# Patient Record
Sex: Male | Born: 1960 | Race: White | Hispanic: No | Marital: Married | Smoking: Former smoker
Health system: Southern US, Community
[De-identification: ages and names within clinical notes are randomized; demographics above are authoritative.]

## PROBLEM LIST (undated history)

## (undated) DIAGNOSIS — M9250 Juvenile osteochondrosis of tibia and fibula, unspecified leg: Secondary | ICD-10-CM

## (undated) DIAGNOSIS — M92529 Juvenile osteochondrosis of tibia tubercle, unspecified leg: Secondary | ICD-10-CM

## (undated) DIAGNOSIS — K219 Gastro-esophageal reflux disease without esophagitis: Secondary | ICD-10-CM

## (undated) HISTORY — DX: Gastro-esophageal reflux disease without esophagitis: K21.9

## (undated) HISTORY — DX: Juvenile osteochondrosis of tibia tubercle, unspecified leg: M92.529

## (undated) HISTORY — PX: ADENOIDECTOMY: SUR15

## (undated) HISTORY — DX: Juvenile osteochondrosis of tibia and fibula, unspecified leg: M92.50

## (undated) HISTORY — PX: TONSILLECTOMY: SUR1361

---

## 2015-08-24 DIAGNOSIS — R21 Rash and other nonspecific skin eruption: Secondary | ICD-10-CM | POA: Diagnosis not present

## 2017-06-21 ENCOUNTER — Telehealth: Payer: Self-pay

## 2017-06-21 NOTE — Telephone Encounter (Signed)
Copied from CRM [phone]. Topic: Inquiry >> Jun 21, 2017  2:58 PM Diana EvesHoyt, Maryann B wrote: Reason for CRM: pts girlfriend calling in stating pt didn't receive new pt paper work and is hoping that can be resent to him.

## 2017-06-21 NOTE — Telephone Encounter (Signed)
I called the patient to confirm that his address was correct and sent out new patient paperwork .

## 2017-07-04 ENCOUNTER — Other Ambulatory Visit: Payer: Self-pay

## 2017-07-04 ENCOUNTER — Encounter: Payer: Self-pay | Admitting: Family Medicine

## 2017-07-04 ENCOUNTER — Ambulatory Visit (INDEPENDENT_AMBULATORY_CARE_PROVIDER_SITE_OTHER): Payer: BLUE CROSS/BLUE SHIELD | Admitting: Family Medicine

## 2017-07-04 VITALS — BP 158/94 | HR 86 | Temp 97.8°F | Ht 74.5 in | Wt 263.0 lb

## 2017-07-04 DIAGNOSIS — J309 Allergic rhinitis, unspecified: Secondary | ICD-10-CM | POA: Insufficient documentation

## 2017-07-04 DIAGNOSIS — R5383 Other fatigue: Secondary | ICD-10-CM | POA: Insufficient documentation

## 2017-07-04 DIAGNOSIS — M545 Low back pain, unspecified: Secondary | ICD-10-CM

## 2017-07-04 DIAGNOSIS — R252 Cramp and spasm: Secondary | ICD-10-CM | POA: Insufficient documentation

## 2017-07-04 DIAGNOSIS — H9193 Unspecified hearing loss, bilateral: Secondary | ICD-10-CM | POA: Diagnosis not present

## 2017-07-04 DIAGNOSIS — L570 Actinic keratosis: Secondary | ICD-10-CM | POA: Insufficient documentation

## 2017-07-04 DIAGNOSIS — R030 Elevated blood-pressure reading, without diagnosis of hypertension: Secondary | ICD-10-CM

## 2017-07-04 DIAGNOSIS — M549 Dorsalgia, unspecified: Secondary | ICD-10-CM | POA: Insufficient documentation

## 2017-07-04 DIAGNOSIS — I10 Essential (primary) hypertension: Secondary | ICD-10-CM | POA: Insufficient documentation

## 2017-07-04 MED ORDER — FLUTICASONE PROPIONATE 50 MCG/ACT NA SUSP: 2.0000 | Freq: Every day | NASAL | 6 refills | Status: DC

## 2017-07-04 NOTE — Assessment & Plan Note (Signed)
Offered referral for evaluation though he declined.

## 2017-07-04 NOTE — Assessment & Plan Note (Signed)
Lesions concerning for actinic keratoses.  Referral to dermatology placed.

## 2017-07-04 NOTE — Assessment & Plan Note (Signed)
Discussed potential causes.  Denies depression.  Could be thyroid related or testosterone related.  Offered lab testing though he deferred to physical exam.

## 2017-07-04 NOTE — Progress Notes (Signed)
Marikay AlarEric Geraldy Akridge, MD Phone: [phone]  Aaron Dawson is a 57 y.o. male who presents today for new patient visit.  Patient reports for a couple of years now he has had some decreased energy.  He just wants to sit around when he is at home.  Does not have the get up and go to go out and do things.  He notes no exertional symptoms.  He denies depression.  Patient notes a couple of dry spots on his right dorsal aspect hand and lower forearm.  Notes they come and go though never completely healed.  Notes sun exposure as a child though none recently.  He reports he has had to use reading glasses recently.  Notes some difficulty hearing that is minor.  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  He reports several months ago having an episode of back discomfort and associated right lateral thigh stinging and burning/numbness.  He notes that has resolved and not recurred.  No incontinence or saddle anesthesia.  No weakness.  He reports issues with drainage and rhinorrhea that intermittently recurred.  He feels this has led to some hoarseness that occurs on some days.  Notes he is breathing well.  Symptoms mostly bother him at night.  He did smoke and quit 2 years ago.  He occasionally has cramps in his calves at night.  He tries to drink water or work.  Notes he has to stretch to get them to feel better.    No history of hypertension.   Active Ambulatory Problems    Diagnosis Date Noted  . Actinic keratoses 07/04/2017  . Decreased energy 07/04/2017  . Decreased hearing of both ears 07/04/2017  . Cramps of lower extremity 07/04/2017  . Back pain 07/04/2017  . Allergic rhinitis 07/04/2017  . Elevated blood pressure reading 07/04/2017   Resolved Ambulatory Problems    Diagnosis Date Noted  . No Resolved Ambulatory Problems   Past Medical History:  Diagnosis Date  . GERD (gastroesophageal reflux disease)   . Osgood-Schlatter's disease     Family History    Problem Relation Age of Onset  . Arthritis Mother   . Diabetes Mother   . Hypertension Mother   . Stroke Mother   . Heart attack Mother   . Arthritis Father   . Alcohol abuse Father   . Diabetes Father   . Hearing loss Father   . Hyperlipidemia Father   . Hypertension Father   . Asthma Father   . COPD Maternal Grandmother   . Diabetes Maternal Grandmother     Social History   Socioeconomic History  . Marital status: Married    Spouse name: Not on file  . Number of children: Not on file  . Years of education: Not on file  . Highest education level: Not on file  Occupational History  . Not on file  Social Needs  . Financial resource strain: Not on file  . Food insecurity:    Worry: Not on file    Inability: Not on file  . Transportation needs:    Medical: Not on file    Non-medical: Not on file  Tobacco Use  . Smoking status: Former Games developermoker  . Smokeless tobacco: Never Used  Substance and Sexual Activity  . Alcohol use: Yes  . Drug use: Never  . Sexual activity: Not on file  Lifestyle  . Physical activity:    Days per week: Not on file    Minutes  per session: Not on file  . Stress: Not on file  Relationships  . Social connections:    Talks on phone: Not on file    Gets together: Not on file    Attends religious service: Not on file    Active member of club or organization: Not on file    Attends meetings of clubs or organizations: Not on file    Relationship status: Not on file  . Intimate partner violence:    Fear of current or ex partner: Not on file    Emotionally abused: Not on file    Physically abused: Not on file    Forced sexual activity: Not on file  Other Topics Concern  . Not on file  Social History Narrative  . Not on file    ROS  General:  Negative for nexplained weight loss, fever Skin: Negative for new or changing mole, sore that won't heal HEENT: Positive for trouble hearing, trouble seeing, hoarseness, negative for ringing in ears,  mouth sores, change in voice, dysphagia. CV:  Negative for chest pain, dyspnea, edema, palpitations Resp: Negative for cough, dyspnea, hemoptysis GI: Negative for nausea, vomiting, diarrhea, constipation, abdominal pain, melena, hematochezia. GU: Negative for dysuria, incontinence, urinary hesitance, hematuria, vaginal or penile discharge, polyuria, sexual difficulty, lumps in testicle or breasts MSK: Positive for muscle cramps or aches, negative for joint pain or swelling Neuro: Negative for headaches, weakness, numbness, dizziness, passing out/fainting Psych: Negative for depression, anxiety, memory problems  Objective  Physical Exam Vitals:   07/04/17 1320  BP: (!) 158/94  Pulse: 86  Temp: 97.8 F (36.6 C)  SpO2: 98%    BP Readings from Last 3 Encounters:  07/04/17 (!) 158/94   Wt Readings from Last 3 Encounters:  07/04/17 263 lb (119.3 kg)    Physical Exam  Constitutional: No distress.  HENT:  Head: Normocephalic and atraumatic.  Mouth/Throat: Oropharynx is clear and moist. No oropharyngeal exudate.  Normal TMs  Eyes: Pupils are equal, round, and reactive to light. Conjunctivae are normal.  Cardiovascular: Normal rate, regular rhythm and normal heart sounds.  Pulmonary/Chest: Effort normal and breath sounds normal.  Abdominal: Soft. Bowel sounds are normal. He exhibits no distension. There is no tenderness. There is no rebound and no guarding.  Musculoskeletal: He exhibits no edema.  Neurological: He is alert. Gait normal.  Skin: Skin is warm and dry. He is not diaphoretic.  Psychiatric: Mood and affect normal.     Assessment/Plan:   Actinic keratoses Lesions concerning for actinic keratoses.  Referral to dermatology placed.  Decreased energy Discussed potential causes.  Denies depression.  Could be thyroid related or testosterone related.  Offered lab testing though he deferred to physical exam.  Decreased hearing of both ears Offered referral for evaluation  though he declined.  Cramps of lower extremity Possibly dehydration related.  Discussed adequate hydration.  Discussed lab evaluation for this though he deferred until his physical.  Advised stretching.  Back pain Low back pain with possible nerve impingement.  This has not recurred.  He monitor for recurrence.  Allergic rhinitis Patient with allergy symptoms that are likely leading to his hoarseness.  I did discuss given the persistence of the hoarseness that we could consider evaluation with ear nose and throat though he opted for treatment for his allergies.  I feel this is reasonable and if he does not improve with regards to his hoarseness with treatment of his allergies he needs to see ENT.  Flonase sent to pharmacy.  Elevated blood pressure reading No prior history though he has not seen a physician in many years.  We will recheck at his physical and if still elevated consider starting medication.   Orders Placed This Encounter  Procedures  . Ambulatory referral to Dermatology    Referral Priority:   Routine    Referral Type:   Consultation    Referral Reason:   Specialty Services Required    Requested Specialty:   Dermatology    Number of Visits Requested:   1    Meds ordered this encounter  Medications  . fluticasone (FLONASE) 50 MCG/ACT nasal spray    Sig: Place 2 sprays into both nostrils daily.    Dispense:  16 g    Refill:  6     [name], MD [facility]

## 2017-07-04 NOTE — Assessment & Plan Note (Signed)
Patient with allergy symptoms that are likely leading to his hoarseness.  I did discuss given the persistence of the hoarseness that we could consider evaluation with ear nose and throat though he opted for treatment for his allergies.  I feel this is reasonable and if he does not improve with regards to his hoarseness with treatment of his allergies he needs to see ENT.  Flonase sent to pharmacy.

## 2017-07-04 NOTE — Assessment & Plan Note (Signed)
Possibly dehydration related.  Discussed adequate hydration.  Discussed lab evaluation for this though he deferred until his physical.  Advised stretching.

## 2017-07-04 NOTE — Patient Instructions (Signed)
Nice to see you. We will trial you on Flonase for your upper respiratory symptoms.  If your symptoms are not improving with this please let us know. Please monitor for recurrence of the right thigh numbness and tingling.  If that recurs please let us know. Please stretch and stay well-hydrated for your cramps. We will have you return for a physical and plan on doing fasting lab work that day.

## 2017-07-04 NOTE — Assessment & Plan Note (Signed)
No prior history though he has not seen a physician in many years.  We will recheck at his physical and if still elevated consider starting medication.

## 2017-07-04 NOTE — Assessment & Plan Note (Addendum)
Low back pain with possible nerve impingement.  This has not recurred.  He monitor for recurrence.

## 2017-09-06 ENCOUNTER — Encounter: Payer: BLUE CROSS/BLUE SHIELD | Admitting: Family Medicine

## 2017-10-04 ENCOUNTER — Other Ambulatory Visit: Payer: Self-pay

## 2017-10-04 ENCOUNTER — Telehealth: Payer: Self-pay | Admitting: Family Medicine

## 2017-10-04 MED ORDER — FLUTICASONE PROPIONATE 50 MCG/ACT NA SUSP: 2.0000 | Freq: Every day | NASAL | 3 refills | Status: DC

## 2017-10-04 NOTE — Telephone Encounter (Signed)
Copied from CRM [id number]. Topic: General - Other >> Oct 04, 2017 11:29 AM Elliot GaultBell, Tiffany M wrote:  Relation to pt: self Call back number: [phone] [id number] [place] [address] [facility] [address] [phone] (Phone) [phone] (Fax)  Reason for call:  Requesting

## 2017-10-07 ENCOUNTER — Ambulatory Visit (INDEPENDENT_AMBULATORY_CARE_PROVIDER_SITE_OTHER): Payer: BLUE CROSS/BLUE SHIELD | Admitting: Family Medicine

## 2017-10-07 ENCOUNTER — Encounter: Payer: Self-pay | Admitting: Family Medicine

## 2017-10-07 VITALS — BP 158/104 | HR 77 | Temp 98.0°F | Resp 16 | Ht 74.5 in | Wt 261.8 lb

## 2017-10-07 DIAGNOSIS — Z114 Encounter for screening for human immunodeficiency virus [HIV]: Secondary | ICD-10-CM

## 2017-10-07 DIAGNOSIS — Z0001 Encounter for general adult medical examination with abnormal findings: Secondary | ICD-10-CM | POA: Insufficient documentation

## 2017-10-07 DIAGNOSIS — R5383 Other fatigue: Secondary | ICD-10-CM | POA: Diagnosis not present

## 2017-10-07 DIAGNOSIS — Z1159 Encounter for screening for other viral diseases: Secondary | ICD-10-CM | POA: Diagnosis not present

## 2017-10-07 DIAGNOSIS — Z1211 Encounter for screening for malignant neoplasm of colon: Secondary | ICD-10-CM

## 2017-10-07 DIAGNOSIS — E669 Obesity, unspecified: Secondary | ICD-10-CM

## 2017-10-07 DIAGNOSIS — H9193 Unspecified hearing loss, bilateral: Secondary | ICD-10-CM

## 2017-10-07 DIAGNOSIS — I10 Essential (primary) hypertension: Secondary | ICD-10-CM | POA: Diagnosis not present

## 2017-10-07 DIAGNOSIS — Z13220 Encounter for screening for lipoid disorders: Secondary | ICD-10-CM

## 2017-10-07 DIAGNOSIS — H539 Unspecified visual disturbance: Secondary | ICD-10-CM

## 2017-10-07 DIAGNOSIS — Z1212 Encounter for screening for malignant neoplasm of rectum: Secondary | ICD-10-CM

## 2017-10-07 DIAGNOSIS — R42 Dizziness and giddiness: Secondary | ICD-10-CM | POA: Diagnosis not present

## 2017-10-07 DIAGNOSIS — Z125 Encounter for screening for malignant neoplasm of prostate: Secondary | ICD-10-CM | POA: Diagnosis not present

## 2017-10-07 DIAGNOSIS — Z23 Encounter for immunization: Secondary | ICD-10-CM

## 2017-10-07 MED ORDER — AMLODIPINE BESYLATE 5 MG PO TABS: 5.0000 mg | ORAL_TABLET | Freq: Every day | ORAL | 3 refills | Status: DC

## 2017-10-07 NOTE — Patient Instructions (Signed)
Nice to see you. We will get lab work today and contact you with the results. Please monitor your depression and decreased energy.  If you would like medication please let us know.  If you develop thoughts of harming yourself please go to the emergency room. Please try to cut down on your alcohol intake.  Please do not cut alcohol out all at once as this would put you at risk for withdrawals and death.

## 2017-10-07 NOTE — Progress Notes (Signed)
Aaron Rumps, MD Phone: [phone]  Aaron Dawson is a 57 y.o. male who presents today for CPE.  Exercises by doing a physical job.  He also works as an Financial controller for sports. Diet is not great.  Rare sweet tea and rare soda. No prior hepatitis C or HIV testing. Tetanus vaccination is due. No prior history of colonoscopy.  No family history of colon cancer. No family history of prostate cancer. Patient quit smoking previously.  Smoked for 30 years 1 to 1.5 packs/day. Drinks a sixpack of beer 3-4 times a week.  Notes occasional marijuana use. He does not see a dentist.  He does note some decreased energy.  Just does not feel like doing things.  He notes some depression.  His parents passed away and that has affected him.  He is trying to take care of the mortgage that they left him.  No SI. He does note rare intermittent dizziness that he states is him feeling as though he is looking out into a haze and has difficulty focusing on things.  No room spinning sensation.  No lightheadedness.  Typically goes away after 10 minutes.  Last occurred a couple weeks ago.  Prior to that it had occurred about 6 months prior.  He does note having to wear readers to help him read though his distance vision is good. He has chronic issues with hearing.  He does wear earplugs at work.  He does not want his hearing tested.  Active Ambulatory Problems    Diagnosis Date Noted  . Actinic keratoses 07/04/2017  . Decreased energy 07/04/2017  . Decreased hearing of both ears 07/04/2017  . Cramps of lower extremity 07/04/2017  . Back pain 07/04/2017  . Allergic rhinitis 07/04/2017  . Hypertension 07/04/2017  . Encounter for general adult medical examination with abnormal findings 10/07/2017  . Vision changes 10/08/2017  . Dizziness 10/08/2017   Resolved Ambulatory Problems    Diagnosis Date Noted  . No Resolved Ambulatory Problems   Past Medical History:  Diagnosis Date  . GERD (gastroesophageal  reflux disease)   . Osgood-Schlatter's disease     Family History  Problem Relation Age of Onset  . Arthritis Mother   . Diabetes Mother   . Hypertension Mother   . Stroke Mother   . Heart attack Mother   . Arthritis Father   . Alcohol abuse Father   . Diabetes Father   . Hearing loss Father   . Hyperlipidemia Father   . Hypertension Father   . Asthma Father   . COPD Maternal Grandmother   . Diabetes Maternal Grandmother     Social History   Socioeconomic History  . Marital status: Married    Spouse name: Not on file  . Number of children: Not on file  . Years of education: Not on file  . Highest education level: Not on file  Occupational History  . Not on file  Social Needs  . Financial resource strain: Not on file  . Food insecurity:    Worry: Not on file    Inability: Not on file  . Transportation needs:    Medical: Not on file    Non-medical: Not on file  Tobacco Use  . Smoking status: Former Research scientist (life sciences)  . Smokeless tobacco: Never Used  Substance and Sexual Activity  . Alcohol use: Yes  . Drug use: Never  . Sexual activity: Not on file  Lifestyle  . Physical activity:    Days per week: Not on file  Minutes per session: Not on file  . Stress: Not on file  Relationships  . Social connections:    Talks on phone: Not on file    Gets together: Not on file    Attends religious service: Not on file    Active member of club or organization: Not on file    Attends meetings of clubs or organizations: Not on file    Relationship status: Not on file  . Intimate partner violence:    Fear of current or ex partner: Not on file    Emotionally abused: Not on file    Physically abused: Not on file    Forced sexual activity: Not on file  Other Topics Concern  . Not on file  Social History Narrative  . Not on file    ROS  General:  Negative for nexplained weight loss, fever Skin: Negative for new or changing mole, sore that won't heal HEENT: Positive for  trouble hearing, trouble seeing, negative for ringing in ears, mouth sores, hoarseness, change in voice, dysphagia. CV:  Negative for chest pain, dyspnea, edema, palpitations Resp: Negative for cough, dyspnea, hemoptysis GI: Negative for nausea, vomiting, diarrhea, constipation, abdominal pain, melena, hematochezia. GU: Negative for dysuria, incontinence, urinary hesitance, hematuria, vaginal or penile discharge, polyuria, sexual difficulty, lumps in testicle or breasts MSK: Positive for muscle cramps or aches, negative for joint pain or swelling Neuro: Positive for dizziness and weakness, negative for headaches, numbness, passing out/fainting Psych: Positive for depression, anxiety, negative for memory problems  Objective  Physical Exam Vitals:   10/07/17 1526 10/07/17 1623  BP: (!) 160/112 (!) 158/104  Pulse: 77   Resp: 16   Temp: 98 F (36.7 C)   SpO2: 98%     BP Readings from Last 3 Encounters:  10/07/17 (!) 158/104  07/04/17 (!) 158/94   Wt Readings from Last 3 Encounters:  10/07/17 261 lb 12.8 oz (118.8 kg)  07/04/17 263 lb (119.3 kg)    Physical Exam  Constitutional: No distress.  HENT:  Mouth/Throat: Oropharynx is clear and moist.  Normal TMs  Eyes: Pupils are equal, round, and reactive to light. Conjunctivae are normal.  Neck: Neck supple.  Cardiovascular: Normal rate, regular rhythm and normal heart sounds.  Pulmonary/Chest: Effort normal and breath sounds normal.  Abdominal: Soft. Bowel sounds are normal. He exhibits no distension. There is no tenderness.  Musculoskeletal: He exhibits no edema.  Lymphadenopathy:    He has no cervical adenopathy.  Neurological: He is alert.  Skin: Skin is warm and dry. He is not diaphoretic.     Assessment/Plan:   Encounter for general adult medical examination with abnormal findings Physical exam completed.  Encouraged to stay active.  Discussed changing several meals a week to healthier options.  Hepatitis C and HIV  testing will be completed.  Tetanus vaccination given.  We will order Cologuard for him and I will send a message to my CMA to do this.  He will check with his insurance company to make sure this is covered.  PSA will be checked.  I discussed decreasing alcoholic beverage intake slowly.  Advised not to discontinue alcohol intake all at once to the risk of withdrawal and death.  Patient opted for screening lab work and deferred in depth lab work given potential for additional cost.  This will be discussed at his next visit.  Hypertension Blood pressure persistently elevated.  Will start on amlodipine.  He will return in 2 weeks for BP check with nursing.  3 months with PCP.  Decreased energy Possibly related to his depression that is mild.  He declined treatment for this at this time.  I discussed return precautions if he were to develop SI.  Discussed potential lab work for decreased energy levels though he deferred this given potential cost concerns.  We will discuss further at next visit.  Decreased hearing of both ears Offered audiology evaluation.  He declined.  Vision changes Needs to see ophthalmologist.  Dizziness This does not sound like true vertigo or lightheadedness.  Occurs very infrequently.  Given that he will monitor and let us know if it occurs again.   Orders Placed This Encounter  Procedures  . Tdap vaccine greater than or equal to 7yo IM  . Comp Met (CMET)  . Lipid panel  . HgB A1c  . PSA  . Hepatitis C Antibody  . HIV antibody (with reflex)    Meds ordered this encounter  Medications  . amLODipine (NORVASC) 5 MG tablet    Sig: Take 1 tablet (5 mg total) by mouth daily.    Dispense:  90 tablet    Refill:  [place], MD [place]

## 2017-10-08 DIAGNOSIS — R42 Dizziness and giddiness: Secondary | ICD-10-CM | POA: Insufficient documentation

## 2017-10-08 DIAGNOSIS — H539 Unspecified visual disturbance: Secondary | ICD-10-CM | POA: Insufficient documentation

## 2017-10-08 LAB — COMPREHENSIVE METABOLIC PANEL
AG Ratio: 1.5 (calc) (ref 1.0–2.5)
ALBUMIN MSPROF: 4.6 g/dL (ref 3.6–5.1)
ALT: 93 U/L — ABNORMAL HIGH (ref 9–46)
AST: 67 U/L — ABNORMAL HIGH (ref 10–35)
Alkaline phosphatase (APISO): 90 U/L (ref 40–115)
BUN: 12 mg/dL (ref 7–25)
CHLORIDE: 103 mmol/L (ref 98–110)
CO2: 22 mmol/L (ref 20–32)
CREATININE: 1.08 mg/dL (ref 0.70–1.33)
Calcium: 10.4 mg/dL — ABNORMAL HIGH (ref 8.6–10.3)
GLUCOSE: 108 mg/dL — AB (ref 65–99)
Globulin: 3.1 g/dL (calc) (ref 1.9–3.7)
Potassium: 5.4 mmol/L — ABNORMAL HIGH (ref 3.5–5.3)
SODIUM: 139 mmol/L (ref 135–146)
TOTAL PROTEIN: 7.7 g/dL (ref 6.1–8.1)
Total Bilirubin: 0.7 mg/dL (ref 0.2–1.2)

## 2017-10-08 LAB — HEMOGLOBIN A1C
EAG (MMOL/L): 6.5 (calc)
Hgb A1c MFr Bld: 5.7 % of total Hgb — ABNORMAL HIGH (ref <5.7)
Mean Plasma Glucose: 117 (calc)

## 2017-10-08 LAB — HEPATITIS C ANTIBODY
HEP C AB: NONREACTIVE
SIGNAL TO CUT-OFF: 0.08 (ref <1.00)

## 2017-10-08 LAB — LIPID PANEL
CHOL/HDL RATIO: 4.5 (calc) (ref <5.0)
Cholesterol: 206 mg/dL — ABNORMAL HIGH (ref <200)
HDL: 46 mg/dL (ref >40)
LDL CHOLESTEROL (CALC): 141 mg/dL — AB
NON-HDL CHOLESTEROL (CALC): 160 mg/dL — AB (ref <130)
Triglycerides: 85 mg/dL (ref <150)

## 2017-10-08 LAB — PSA: PSA: 1.1 ng/mL (ref <=4.0)

## 2017-10-08 LAB — HIV ANTIBODY (ROUTINE TESTING W REFLEX): HIV 1&2 Ab, 4th Generation: NONREACTIVE

## 2017-10-08 NOTE — Assessment & Plan Note (Signed)
Blood pressure persistently elevated.  Will start on amlodipine.  He will return in 2 weeks for BP check with nursing.  3 months with PCP.

## 2017-10-08 NOTE — Assessment & Plan Note (Signed)
Physical exam completed.  Encouraged to stay active.  Discussed changing several meals a week to healthier options.  Hepatitis C and HIV testing will be completed.  Tetanus vaccination given.  We will order Cologuard for him and I will send a message to my CMA to do this.  He will check with his insurance company to make sure this is covered.  PSA will be checked.  I discussed decreasing alcoholic beverage intake slowly.  Advised not to discontinue alcohol intake all at once to the risk of withdrawal and death.  Patient opted for screening lab work and deferred in depth lab work given potential for additional cost.  This will be discussed at his next visit.

## 2017-10-08 NOTE — Assessment & Plan Note (Signed)
Offered audiology evaluation.  He declined.

## 2017-10-08 NOTE — Assessment & Plan Note (Addendum)
Needs to see ophthalmologist.  

## 2017-10-08 NOTE — Assessment & Plan Note (Signed)
This does not sound like true vertigo or lightheadedness.  Occurs very infrequently.  Given that he will monitor and let us know if it occurs again.

## 2017-10-08 NOTE — Assessment & Plan Note (Signed)
Possibly related to his depression that is mild.  He declined treatment for this at this time.  I discussed return precautions if he were to develop SI.  Discussed potential lab work for decreased energy levels though he deferred this given potential cost concerns.  We will discuss further at next visit.

## 2017-10-10 NOTE — Progress Notes (Signed)
ordered

## 2017-10-10 NOTE — Addendum Note (Signed)
Addended by: Inetta FermoHENDRICKS, Dennie Vecchio S on: 10/10/2017 03:42 PM   Modules accepted: Orders

## 2017-10-11 ENCOUNTER — Other Ambulatory Visit: Payer: Self-pay | Admitting: Family Medicine

## 2017-10-11 DIAGNOSIS — R7989 Other specified abnormal findings of blood chemistry: Secondary | ICD-10-CM

## 2017-10-11 DIAGNOSIS — R945 Abnormal results of liver function studies: Secondary | ICD-10-CM

## 2017-10-11 DIAGNOSIS — E785 Hyperlipidemia, unspecified: Secondary | ICD-10-CM

## 2017-10-11 MED ORDER — ROSUVASTATIN CALCIUM 20 MG PO TABS: 20.0000 mg | ORAL_TABLET | Freq: Every day | ORAL | 3 refills | Status: AC

## 2017-10-13 ENCOUNTER — Other Ambulatory Visit (INDEPENDENT_AMBULATORY_CARE_PROVIDER_SITE_OTHER): Payer: BLUE CROSS/BLUE SHIELD

## 2017-10-13 DIAGNOSIS — R945 Abnormal results of liver function studies: Secondary | ICD-10-CM | POA: Diagnosis not present

## 2017-10-13 DIAGNOSIS — R7989 Other specified abnormal findings of blood chemistry: Secondary | ICD-10-CM

## 2017-10-14 LAB — COMPREHENSIVE METABOLIC PANEL
ALBUMIN: 4.6 g/dL (ref 3.5–5.2)
ALT: 104 U/L — ABNORMAL HIGH (ref 0–53)
AST: 83 U/L — ABNORMAL HIGH (ref 0–37)
Alkaline Phosphatase: 63 U/L (ref 39–117)
BUN: 16 mg/dL (ref 6–23)
CALCIUM: 9.9 mg/dL (ref 8.4–10.5)
CHLORIDE: 103 meq/L (ref 96–112)
CO2: 24 meq/L (ref 19–32)
Creatinine, Ser: 1.13 mg/dL (ref 0.40–1.50)
GFR: 71.34 mL/min (ref >60.00)
Glucose, Bld: 94 mg/dL (ref 70–99)
POTASSIUM: 4.6 meq/L (ref 3.5–5.1)
Sodium: 138 mEq/L (ref 135–145)
Total Bilirubin: 0.8 mg/dL (ref 0.2–1.2)
Total Protein: 8 g/dL (ref 6.0–8.3)

## 2017-10-24 ENCOUNTER — Telehealth: Payer: Self-pay | Admitting: Family Medicine

## 2017-10-24 NOTE — Telephone Encounter (Signed)
Copied from CRM [id number]. Topic: Quick Communication - Lab Results >> Oct 24, 2017  1:42 PM Inetta FermoHendricks, Jessica S, CMA wrote: Left message to return call, ok for [facility] to give results and speak to patient and schedule lab appointment  Please call about 4 pm, pt is at work and can not answer phone until then

## 2017-10-25 ENCOUNTER — Ambulatory Visit (INDEPENDENT_AMBULATORY_CARE_PROVIDER_SITE_OTHER): Payer: BLUE CROSS/BLUE SHIELD | Admitting: *Deleted

## 2017-10-25 ENCOUNTER — Encounter: Payer: Self-pay | Admitting: *Deleted

## 2017-10-25 DIAGNOSIS — R945 Abnormal results of liver function studies: Secondary | ICD-10-CM

## 2017-10-25 DIAGNOSIS — R7989 Other specified abnormal findings of blood chemistry: Secondary | ICD-10-CM

## 2017-10-25 DIAGNOSIS — I10 Essential (primary) hypertension: Secondary | ICD-10-CM

## 2017-10-25 NOTE — Progress Notes (Signed)
Patient presented for BP check from OV 10/07/17 added amlodipine 5 mg daily patient takes at 6 pm daily per patient . BP left arm 130/90 pulse 76, allowed patient to rest additional 15 minutes right arm BP attained 130/88 pulse 75. Patient lab results given see lab note. Patient also had question concerning  flonase patient taking 2 sprays to each nare twice daily but script written for 2 sprays to each nare once daily patient wants to know can this be increased to twice daily. Advised patient to stop twice daily dosing until hears from PCP.

## 2017-10-25 NOTE — Telephone Encounter (Signed)
Charted in result notes. 

## 2017-10-31 NOTE — Progress Notes (Signed)
Blood pressure still above goal.  He should increase amlodipine to 10 mg daily.  He should have a BP check with nursing in 1 month.  He should only use the Flonase 2 sprays each nostril once daily.  He should not be using this twice daily.

## 2017-11-01 MED ORDER — AMLODIPINE BESYLATE 10 MG PO TABS: 10.0000 mg | ORAL_TABLET | Freq: Every day | ORAL | 1 refills | Status: DC

## 2017-11-01 NOTE — Addendum Note (Signed)
Addended by: Dennie BibleAVIS, KATHY R on: 11/01/2017 03:11 PM   Modules accepted: Orders

## 2017-11-01 NOTE — Progress Notes (Signed)
Patient agreed to increase amlodipine to 10 mg daily sent script to pharmacy and schedule nurse visit for one month recheck and advised patient not to use Flonase more than prescribed patient voiced understanding.

## 2017-11-08 ENCOUNTER — Other Ambulatory Visit: Payer: BLUE CROSS/BLUE SHIELD

## 2017-11-15 ENCOUNTER — Other Ambulatory Visit (INDEPENDENT_AMBULATORY_CARE_PROVIDER_SITE_OTHER): Payer: BLUE CROSS/BLUE SHIELD

## 2017-11-15 DIAGNOSIS — E785 Hyperlipidemia, unspecified: Secondary | ICD-10-CM

## 2017-11-16 LAB — HEPATIC FUNCTION PANEL
ALT: 60 U/L — AB (ref 0–53)
AST: 46 U/L — AB (ref 0–37)
Albumin: 4.8 g/dL (ref 3.5–5.2)
Alkaline Phosphatase: 62 U/L (ref 39–117)
BILIRUBIN DIRECT: 0.2 mg/dL (ref 0.0–0.3)
BILIRUBIN TOTAL: 0.9 mg/dL (ref 0.2–1.2)
Total Protein: 8.4 g/dL — ABNORMAL HIGH (ref 6.0–8.3)

## 2017-11-16 LAB — LDL CHOLESTEROL, DIRECT: Direct LDL: 137 mg/dL

## 2017-11-17 ENCOUNTER — Other Ambulatory Visit: Payer: Self-pay | Admitting: Family Medicine

## 2017-11-17 DIAGNOSIS — R945 Abnormal results of liver function studies: Principal | ICD-10-CM

## 2017-11-17 DIAGNOSIS — R7989 Other specified abnormal findings of blood chemistry: Secondary | ICD-10-CM

## 2017-11-21 ENCOUNTER — Encounter: Payer: Self-pay | Admitting: Gastroenterology

## 2017-11-25 ENCOUNTER — Other Ambulatory Visit: Payer: Self-pay

## 2017-11-25 ENCOUNTER — Encounter: Payer: Self-pay | Admitting: Emergency Medicine

## 2017-11-25 ENCOUNTER — Emergency Department: Payer: BLUE CROSS/BLUE SHIELD

## 2017-11-25 ENCOUNTER — Emergency Department
Admission: EM | Admit: 2017-11-25 | Discharge: 2017-11-25 | Disposition: A | Discharge status: Home / Self Care | Payer: BLUE CROSS/BLUE SHIELD | Attending: Emergency Medicine | Admitting: Emergency Medicine

## 2017-11-25 DIAGNOSIS — Y990 Civilian activity done for income or pay: Secondary | ICD-10-CM | POA: Diagnosis not present

## 2017-11-25 DIAGNOSIS — W2107XA Struck by softball, initial encounter: Secondary | ICD-10-CM | POA: Diagnosis not present

## 2017-11-25 DIAGNOSIS — S0993XA Unspecified injury of face, initial encounter: Secondary | ICD-10-CM | POA: Diagnosis not present

## 2017-11-25 DIAGNOSIS — Z87891 Personal history of nicotine dependence: Secondary | ICD-10-CM | POA: Insufficient documentation

## 2017-11-25 DIAGNOSIS — Y92320 Baseball field as the place of occurrence of the external cause: Secondary | ICD-10-CM | POA: Insufficient documentation

## 2017-11-25 DIAGNOSIS — Y9389 Activity, other specified: Secondary | ICD-10-CM | POA: Diagnosis not present

## 2017-11-25 DIAGNOSIS — I10 Essential (primary) hypertension: Secondary | ICD-10-CM | POA: Insufficient documentation

## 2017-11-25 DIAGNOSIS — Z79899 Other long term (current) drug therapy: Secondary | ICD-10-CM | POA: Diagnosis not present

## 2017-11-25 DIAGNOSIS — S0242XA Fracture of alveolus of maxilla, initial encounter for closed fracture: Secondary | ICD-10-CM | POA: Diagnosis not present

## 2017-11-25 MED ORDER — OXYCODONE-ACETAMINOPHEN 5-325 MG PO TABS: 2.0000 | ORAL_TABLET | ORAL | 0 refills | Status: AC | PRN

## 2017-11-25 MED ORDER — OXYCODONE-ACETAMINOPHEN 5-325 MG PO TABS
ORAL_TABLET | ORAL | Status: AC
  Filled 2017-11-25: qty 1

## 2017-11-25 MED ORDER — OXYCODONE-ACETAMINOPHEN 5-325 MG PO TABS
1.0000 | ORAL_TABLET | Freq: Once | ORAL | Status: AC
  Administered 2017-11-25: 1 via ORAL

## 2017-11-25 NOTE — Discharge Instructions (Signed)
Please follow up with a dentist. Please follow the soft diet eating plan

## 2017-11-25 NOTE — ED Notes (Signed)
This RN attempted to reach patients supervisor Johnnette LitterJim Lorocca at [phone] but no answer.

## 2017-11-25 NOTE — ED Provider Notes (Signed)
[facility] Emergency Department Provider Note   ____________________________________________   First MD Initiated Contact with Patient 11/25/17 2323     (approximate)  I have reviewed the triage vital signs and the nursing notes.   HISTORY  Chief Complaint Dental Injury    HPI Aaron Dawson is a 57 y.o. male who comes into the hospital today because he was hit in the mouth with a softball.  He reports that it knocked his teeth back.  He was bleeding a little bit but the drove himself here.  The patient did not have any loss of consciousness and it hit him directly in the mouth.  His left central incisor is loose in his right central incisor is pushed back.  The patient does not have a dentist currently but came here for evaluation.  The patient rates his pain a 7-8 out of 10 in intensity.  He has had no vomiting no nausea no other concerns.   Past Medical History:  Diagnosis Date  . GERD (gastroesophageal reflux disease)   . Osgood-Schlatter's disease     Patient Active Problem List   Diagnosis Date Noted  . Vision changes 10/08/2017  . Dizziness 10/08/2017  . Encounter for general adult medical examination with abnormal findings 10/07/2017  . Actinic keratoses 07/04/2017  . Decreased energy 07/04/2017  . Decreased hearing of both ears 07/04/2017  . Cramps of lower extremity 07/04/2017  . Back pain 07/04/2017  . Allergic rhinitis 07/04/2017  . Hypertension 07/04/2017    Past Surgical History:  Procedure Laterality Date  . ADENOIDECTOMY    . TONSILLECTOMY      Prior to Admission medications   Medication Sig Start Date End Date Taking? Authorizing Provider  amLODipine (NORVASC) 10 MG tablet Take 1 tablet (10 mg total) by mouth daily. 11/01/17   Glori LuisSonnenberg, Eric G, MD  fluticasone (FLONASE) 50 MCG/ACT nasal spray Place 2 sprays into both nostrils daily. Patient taking differently: Place 2 sprays into both nostrils daily.  10/04/17    Glori LuisSonnenberg, Eric G, MD  oxyCODONE-acetaminophen (PERCOCET/ROXICET) 5-325 MG tablet Take 2 tablets by mouth every 4 (four) hours as needed for severe pain. 11/25/17   Rebecka ApleyWebster, Cathren Sween P, MD  rosuvastatin (CRESTOR) 20 MG tablet Take 1 tablet (20 mg total) by mouth daily. 10/11/17   Glori LuisSonnenberg, Eric G, MD    Allergies Penicillins  Family History  Problem Relation Age of Onset  . Arthritis Mother   . Diabetes Mother   . Hypertension Mother   . Stroke Mother   . Heart attack Mother   . Arthritis Father   . Alcohol abuse Father   . Diabetes Father   . Hearing loss Father   . Hyperlipidemia Father   . Hypertension Father   . Asthma Father   . COPD Maternal Grandmother   . Diabetes Maternal Grandmother     Social History Social History   Tobacco Use  . Smoking status: Former Games developermoker  . Smokeless tobacco: Never Used  Substance Use Topics  . Alcohol use: Yes  . Drug use: Never    Review of Systems  Constitutional: No fever/chills Eyes: No visual changes. ENT: dental pain Cardiovascular: Denies chest pain. Respiratory: Denies shortness of breath. Gastrointestinal: No abdominal pain.  No nausea, no vomiting.   Genitourinary: Negative for dysuria. Musculoskeletal: Negative for back pain. Skin: Negative for rash. Neurological: Negative for headaches, focal weakness or numbness.   ____________________________________________   PHYSICAL EXAM:  VITAL SIGNS: ED Triage Vitals  Enc Vitals  Group     BP 11/25/17 2000 (!) 151/86     Pulse Rate 11/25/17 2000 71     Resp 11/25/17 2000 18     Temp 11/25/17 2000 97.7 F (36.5 C)     Temp Source 11/25/17 2000 Oral     SpO2 11/25/17 2000 98 %     Weight 11/25/17 2001 242 lb (109.8 kg)     Height 11/25/17 2001 6' 2.5" (1.892 m)     Head Circumference --      Peak Flow --      Pain Score 11/25/17 2000 7     Pain Loc --      Pain Edu? --      Excl. in GC? --     Constitutional: Alert and oriented. Well appearing and in  moderate distress. Eyes: Conjunctivae are normal. PERRL. EOMI. Head: Atraumatic. Nose: No congestion/rhinnorhea. Mouth/Throat: Mucous membranes are moist.  Oropharynx non-erythematous. Loose left central incisor, posterior subluxation of right central incisor Cardiovascular: Normal rate, regular rhythm. Grossly normal heart sounds.  Good peripheral circulation. Respiratory: Normal respiratory effort.  No retractions. Lungs CTAB. Gastrointestinal: Soft and nontender. No distention. Musculoskeletal: No lower extremity tenderness nor edema.   Neurologic:  Normal speech and language.  Skin:  Skin is warm, dry and intact.  Psychiatric: Mood and affect are normal.   ____________________________________________   LABS (all labs ordered are listed, but only abnormal results are displayed)  Labs Reviewed - No data to display ____________________________________________  EKG  none ____________________________________________  RADIOLOGY  ED MD interpretation:  CT max face: Acute fracture of the maxillary alveolar bone centered at the right central incisor extending to the right lateral and left central incisor, right central incisor root is anteriorly displaced from its cavity.  Official radiology report(s): Ct Maxillofacial Wo Contrast  Result Date: 11/25/2017 CLINICAL DATA:  57 y/o M; hit in face by softball. Teeth 8, 9, and 10 hit by the ball. EXAM: CT MAXILLOFACIAL WITHOUT CONTRAST TECHNIQUE: Multidetector CT imaging of the maxillofacial structures was performed. Multiplanar CT image reconstructions were also generated. COMPARISON:  None. FINDINGS: Osseous: Acute fracture of the maxillary alveolar bone centered at the right central incisor extending to the right lateral incisor and left central incisors. The right central incisor root is anteriorly displaced from its cavity. No additional fracture or dislocation identified. Orbits: Negative. No traumatic or inflammatory finding. Sinuses:  Clear. Soft tissues: Soft tissue swelling of the upper lip. Limited intracranial: No significant or unexpected finding. IMPRESSION: Acute fracture of the maxillary alveolar bone centered at the right central incisor extending to the right lateral and left central incisors. The right central incisor root is anteriorly displaced from its cavity. Electronically Signed   By: Mitzi Hansen M.D.   On: 11/25/2017 20:38    ____________________________________________   PROCEDURES  Procedure(s) performed: None  Procedures  Critical Care performed: No  ____________________________________________   INITIAL IMPRESSION / ASSESSMENT AND PLAN / ED COURSE  As part of my medical decision making, I reviewed the following data within the electronic MEDICAL RECORD NUMBER Notes from prior ED visits and [place] Controlled Substance Database   This is a 57 year old male who comes into the hospital today after being hit in the mouth with a softball.  The patient does have some dental injury and displacement of his central incisors.  I did give the patient a dose of Percocet.  I have instructed him that he needs to follow-up with a dentist to have his incisors replaced  and splinted with braces.  The patient reports that he will follow-up.  He will be discharged home to follow-up.      ____________________________________________   FINAL CLINICAL IMPRESSION(S) / ED DIAGNOSES  Final diagnoses:  Closed fracture of alveolar process of maxilla, initial encounter [facility])     ED Discharge Orders         Ordered    oxyCODONE-acetaminophen (PERCOCET/ROXICET) 5-325 MG tablet  Every 4 hours PRN     11/25/17 2334           Note:  This document was prepared using Dragon voice recognition software and may include unintentional dictation errors.    Rebecka Apley, MD 11/25/17 [phone]

## 2017-11-25 NOTE — ED Triage Notes (Signed)
Pt ambulatory to triage with no difficulty. 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 Teeth 8, 9, and 10 hit by the ball. Tooth 9 is pushed back and pt reports tooth ten is about to fall out. Denies other injury.

## 2017-12-01 ENCOUNTER — Ambulatory Visit (INDEPENDENT_AMBULATORY_CARE_PROVIDER_SITE_OTHER): Payer: BLUE CROSS/BLUE SHIELD

## 2017-12-01 DIAGNOSIS — I10 Essential (primary) hypertension: Secondary | ICD-10-CM

## 2017-12-01 NOTE — Progress Notes (Signed)
BP looks very good.   LG

## 2017-12-01 NOTE — Progress Notes (Signed)
Patient comes in for 1 month blood pressure check since increasing amlodipine to 10mg .  Checked blood pressure left arm 130/82 right arm 126/86 pulse 72 O2 99% .    Patient has not been monitoring blood pressure at home.   No side effects from amlodipine .  Advised patient he will be contacted in regards to blood pressure readings.

## 2017-12-03 NOTE — Progress Notes (Signed)
BP reviewed. Diastolic BP still not at goal. Please advise the patient to start checking his BP at home and get the readings to us in 2 weeks. We can then determine if he needs additional medication.

## 2017-12-07 ENCOUNTER — Telehealth: Payer: Self-pay

## 2017-12-07 NOTE — Telephone Encounter (Signed)
Left voicemail for patient to call in regards to blood pressure check . Please transfer to office.

## 2017-12-07 NOTE — Telephone Encounter (Deleted)
1  Aaron Dawson Male, 57 y.o., [birth year] MRN:  [id number] Phone:  [phone] Aaron Dawson) PCP:  Glori Luis, MD Primary Cvg:  BLUE CROSS BLUE SHIELD/BCBS OTHER Next Appt With Family Medicine Aaron Alar, MD) 01/25/2018 at 4:00 PM Message  Received: 4 days ago  Message Contents  Aaron Dawson, Aaron Mao, MD  Aaron Dawson, CMA      Previous Messages      Routed Note   Author: Glori Luis, MD Service: Family Medicine Author Type: Physician  Filed: 12/03/2017 2:58 PM Encounter Date: 12/01/2017 Status: Signed  Editor: Glori Luis, MD (Physician)     Show:Clear all [x] Manual[] Template[] Copied  Added by: [x] Glori Luis, MD  [] Hover for details BP reviewed. Diastolic BP still not at goal. Please advise the patient to start checking his BP at home and get the readings to [place] in 2 weeks. We can then determine if he needs additional medication.     Clinical Support for Blood Pressure Check  12/01/2017  Aaron Dawson, CMA - [facility] Visit Summary  Diagnosis    Essential Hypertension (Primary)  Orders Signed This Visit   None  Orders Pended This Visit   None  Progress Notes   Aaron Dawson, CMA at 12/01/2017 4:00 PM   Status: Signed Cosigner: Tracey Harries, FNP at 12/01/2017 4:59 PM    Patient comes in for 1 month blood pressure check since increasing amlodipine to 10mg .  Checked blood pressure left arm 130/82 right arm 126/86 pulse 72 O2 99% .    Patient has not been monitoring blood pressure at home.   No side effects from amlodipine .  Advised patient he will be contacted in regards to blood pressure readings.       Tracey Harries, FNP at 12/01/2017 4:00 PM   Status: Signed    BP looks very good.   LG    Glori Luis, MD at 12/01/2017 4:00 PM   Status: Signed    BP reviewed. Diastolic BP still not at goal. Please advise the patient to start checking his BP at home and get the readings to [place]  in 2 weeks. We can then determine if he needs additional medication.         1  Aaron Dawson Male, 57 y.o., 1960/05/15 MRN:  [id number] Phone:  [phone] Aaron Dawson) PCP:  Glori Luis, MD Primary Cvg:  BLUE CROSS BLUE SHIELD/BCBS OTHER Next Appt With Family Medicine Aaron Alar, MD) 01/25/2018 at 4:00 PM Message  Received: 4 days ago  Message Contents  Aaron Dawson, Aaron Mao, MD  Aaron Dawson, CMA      Previous Messages      Routed Note   Author: Glori Luis, MD Service: Family Medicine Author Type: Physician  Filed: 12/03/2017 2:58 PM Encounter Date: 12/01/2017 Status: Signed  Editor: Glori Luis, MD (Physician)     Show:Clear all [x] Manual[] Template[] Copied  Added by: [x] Glori Luis, MD  [] Hover for details BP reviewed. Diastolic BP still not at goal. Please advise the patient to start checking his BP at home and get the readings to [place] in 2 weeks. We can then determine if he needs additional medication.     Clinical Support for Blood Pressure Check  12/01/2017  Aaron Dawson, CMA -  Primary Care  Visit Summary  Diagnosis    Essential Hypertension (Primary)  Orders Signed This Visit   None  Orders Pended This Visit   None  Progress Notes   Aaron Dawson, CMA at 12/01/2017 4:00 PM   Status: Signed Cosigner: Tracey Harries, FNP at 12/01/2017 4:59 PM    Patient comes in for 1 month blood pressure check since increasing amlodipine to 10mg .  Checked blood pressure left arm 130/82 right arm 126/86 pulse 72 O2 99% .    Patient has not been monitoring blood pressure at home.   No side effects from amlodipine .  Advised patient he will be contacted in regards to blood pressure readings.       Tracey Harries, FNP at 12/01/2017 4:00 PM   Status: Signed    BP looks very good.   LG    Glori Luis, MD at 12/01/2017 4:00 PM   Status: Signed    BP reviewed. Diastolic BP still not at goal. Please  advise the patient to start checking his BP at home and get the readings to [place] in 2 weeks. We can then determine if he needs additional medication.

## 2017-12-07 NOTE — Progress Notes (Signed)
Left voice mail for patient to call.

## 2017-12-07 NOTE — Telephone Encounter (Signed)
Spoke with patient in regards to blood pressure check, see phone note for documentation

## 2017-12-07 NOTE — Progress Notes (Signed)
Patient advised of below and he will bring in readings .

## 2017-12-13 NOTE — Telephone Encounter (Signed)
Continue amlodipine as prescribed. Continue to monitor Bps, appt upcoming in October. I would not add any new medications right now, but when Dr [name] sees him at visit, it is possible another BP med will be added.

## 2017-12-13 NOTE — Telephone Encounter (Signed)
Dr Purvis Sheffield patient

## 2017-12-13 NOTE — Telephone Encounter (Signed)
Patient bp readings

## 2017-12-13 NOTE — Telephone Encounter (Signed)
Pt called to let nurse know his bp has been running high this week; but pt was told to call in and give bp readings; so bp readings for   9/5 148/91  9/6 141/90  9/7 135/86  9/8 137/88  9/9 150/93  9/10 159/98  Contact pt if needed

## 2017-12-14 NOTE — Telephone Encounter (Signed)
Called and left voicemail for patient per( DPR) for patient to continue amlodipine and that Lauren will not be making any changes at this time and to make sure he continues to check blood pressures and medication changes and adjustments can be discussed at next office visit and to call the office if he has any further questions or concerns.

## 2017-12-28 ENCOUNTER — Telehealth: Payer: Self-pay | Admitting: Family Medicine

## 2017-12-28 NOTE — Telephone Encounter (Signed)
Placed in Dr. Kalman ShanSonnenberg's Blue folder to review.

## 2017-12-28 NOTE — Telephone Encounter (Signed)
Pt dropped off his BP readings for Dr. [name]. Readings are up front in Dr. Purvis Sheffield color folder.

## 2017-12-29 NOTE — Telephone Encounter (Signed)
Called and spoke with pt. Pt advised and voiced understanding. Pt stated that he has been using the armatron bp monitor at home he brought this OTC he BP always seem to be elevated [facility] unsure if something is wrong with there BP machine or not. Pt advised to keep monitoring his BP and to f/u if elevated.

## 2017-12-29 NOTE — Telephone Encounter (Signed)
Patient's home readings are at goal.  He should continue to monitor and if they elevated contact us.

## 2018-01-11 DIAGNOSIS — L0100 Impetigo, unspecified: Secondary | ICD-10-CM | POA: Diagnosis not present

## 2018-01-13 ENCOUNTER — Telehealth: Payer: Self-pay | Admitting: Family Medicine

## 2018-01-13 NOTE — Telephone Encounter (Signed)
Pt dropped off his BP readings. Paper is up front in color  Folder.

## 2018-01-17 NOTE — Telephone Encounter (Signed)
Placed at PCP's desk to review.

## 2018-01-17 NOTE — Telephone Encounter (Signed)
Called and spoke with pt. Patient advised and voiced understanding.

## 2018-01-17 NOTE — Telephone Encounter (Signed)
Patient's BP appears to be adequately controlled at home.  No changes to medication.

## 2018-01-25 ENCOUNTER — Ambulatory Visit: Payer: BLUE CROSS/BLUE SHIELD | Admitting: Family Medicine

## 2018-05-26 ENCOUNTER — Other Ambulatory Visit: Payer: Self-pay | Admitting: Family Medicine

## 2018-05-31 ENCOUNTER — Other Ambulatory Visit: Payer: Self-pay | Admitting: Family Medicine

## 2018-05-31 MED ORDER — FLUTICASONE PROPIONATE 50 MCG/ACT NA SUSP: 2.0000 | Freq: Every day | NASAL | 3 refills | Status: DC

## 2018-05-31 NOTE — Telephone Encounter (Signed)
Copied from CRM [id number]. Topic: Quick Communication - Rx Refill/Question >> May 31, 2018 10:41 AM Aaron Dawson wrote: Medication: fluticasone (FLONASE) 50 MCG/ACT nasal spray  Pt states the pharmacy advised they have sent 2 X.  But I do not see this request.  Pt states he is out and uses this daily.  [id number] - [place], [place] - [address] AT [facility] [address] [place] [phone] (Phone) [phone] (Fax)  ALSO pt's amLODipine (NORVASC) 10 MG [facility]. When pt tried to transfer, they told him the dr office had to call and have it transferred.  Pt would like to know if you can do this for him

## 2018-05-31 NOTE — Telephone Encounter (Signed)
Requested Prescriptions  Pending Prescriptions Disp Refills  . fluticasone (FLONASE) 50 MCG/ACT nasal spray 16 g 3    Sig: Place 2 sprays into both nostrils daily.     Ear, Nose, and Throat: Nasal Preparations - Corticosteroids Passed - 05/31/2018 10:57 AM      Passed - Valid encounter within last 12 months    Recent Outpatient Visits          7 months ago Encounter for general adult medical examination with abnormal findings   [facility], [name], MD   11 months ago Actinic keratoses   [facility] [place], [name], MD

## 2018-09-12 ENCOUNTER — Other Ambulatory Visit: Payer: Self-pay | Admitting: Family Medicine

## 2018-09-13 ENCOUNTER — Telehealth: Payer: Self-pay | Admitting: Family Medicine

## 2018-09-13 NOTE — Telephone Encounter (Signed)
Pt needs the following medication refills; fluticasone (FLONASE) 50 MCG/ACT nasal spray and amLODipine (NORVASC) 10 MG tablet.  [facility] [address]

## 2018-10-08 ENCOUNTER — Other Ambulatory Visit: Payer: Self-pay | Admitting: Family Medicine

## 2018-10-10 ENCOUNTER — Telehealth: Payer: Self-pay

## 2018-10-10 DIAGNOSIS — Z1212 Encounter for screening for malignant neoplasm of rectum: Secondary | ICD-10-CM

## 2018-10-10 DIAGNOSIS — Z1211 Encounter for screening for malignant neoplasm of colon: Secondary | ICD-10-CM

## 2018-10-10 NOTE — Telephone Encounter (Signed)
Cologuard expired Informed patient and asked if we could reorder the cologuard, pt stated his job closed permanently and he has no insurance will call back when he gets insurance in about 1-2 months.  Nina,cma

## 2018-11-07 ENCOUNTER — Other Ambulatory Visit: Payer: Self-pay | Admitting: Family Medicine

## 2018-11-20 ENCOUNTER — Other Ambulatory Visit: Payer: Self-pay | Admitting: Family Medicine

## 2018-12-08 ENCOUNTER — Other Ambulatory Visit: Payer: Self-pay | Admitting: Family Medicine

## 2018-12-08 NOTE — Telephone Encounter (Signed)
The patient has not been seen in over a year.  He needs an appointment scheduled for follow-up prior to having this medication refilled.  Thanks.

## 2018-12-08 NOTE — Telephone Encounter (Signed)
Last OV 10/07/2017 ok to fill?

## 2018-12-25 NOTE — Telephone Encounter (Signed)
[employer] for [employer] to advise.  I called and left a message on voicemail for patient to call and schedule a f/up with the provider in order to get medication refills in the future.  Nina,cma

## 2018-12-27 NOTE — Telephone Encounter (Signed)
Pharmacy called for pt to request a refill of  amLODipine (NORVASC) 10 MG tablet  Pharmacy states pt's insurance will only pay for a 90 day.  Pt has appt 01/16/19, but is out of his medication. Requesting [facility]:  [id number] [address], [place] (Phone) [phone] (Fax)

## 2018-12-27 NOTE — Telephone Encounter (Signed)
Requested medication (s) are due for refill today: yes  Requested medication (s) are on the active medication list: yes  Last refill:  09/12/2018  Future visit scheduled: yes  Notes to clinic:  Patient states that insurance will only cover 90 day supply.    Requested Prescriptions  Pending Prescriptions Disp Refills   amLODipine (NORVASC) 10 MG tablet 60 tablet 1    Sig: Take 1 tablet (10 mg total) by mouth daily.     Cardiovascular:  Calcium Channel Blockers Failed - 12/27/2018  2:13 PM      Failed - Valid encounter within last 6 months    Recent Outpatient Visits          1 year ago Encounter for general adult medical examination with abnormal findings   [facility], Angela Adam, MD   1 year ago Actinic keratoses   [facility] [place], Angela Adam, MD      Future Appointments            In 2 weeks Caryl Bis, Angela Adam, MD [facility] [place] BP in normal range    BP Readings from Last 1 Encounters:  11/25/17 136/78         Refused Prescriptions Disp Refills   amLODipine (NORVASC) 10 MG tablet [Pharmacy Med Name: AMLODIPINE BESYLATE 10 MG TAB] 90 tablet 1    Sig: TAKE 1 TABLET BY MOUTH EVERY DAY     Cardiovascular:  Calcium Channel Blockers Failed - 12/27/2018  2:13 PM      Failed - Valid encounter within last 6 months    Recent Outpatient Visits          1 year ago Encounter for general adult medical examination with abnormal findings   [facility] Leone Haven, MD   1 year ago Actinic keratoses   [facility] [place], Angela Adam, MD      Future Appointments            In 2 weeks Caryl Bis Angela Adam, MD [facility] [place] BP in normal range    BP Readings from Last 1 Encounters:  11/25/17 136/78

## 2018-12-28 ENCOUNTER — Other Ambulatory Visit: Payer: Self-pay | Admitting: Family Medicine

## 2019-01-11 ENCOUNTER — Other Ambulatory Visit: Payer: Self-pay

## 2019-01-16 ENCOUNTER — Ambulatory Visit: Payer: BLUE CROSS/BLUE SHIELD | Admitting: Family Medicine

## 2019-01-19 ENCOUNTER — Other Ambulatory Visit: Payer: Self-pay | Admitting: Family Medicine

## 2019-01-30 ENCOUNTER — Ambulatory Visit: Payer: Self-pay | Admitting: Family Medicine

## 2019-03-27 ENCOUNTER — Encounter: Payer: Self-pay | Admitting: Family Medicine

## 2019-03-27 ENCOUNTER — Telehealth: Payer: Self-pay | Admitting: Family Medicine

## 2019-03-27 ENCOUNTER — Other Ambulatory Visit: Payer: Self-pay

## 2019-03-27 ENCOUNTER — Ambulatory Visit (INDEPENDENT_AMBULATORY_CARE_PROVIDER_SITE_OTHER): Payer: Self-pay | Admitting: Family Medicine

## 2019-03-27 DIAGNOSIS — J309 Allergic rhinitis, unspecified: Secondary | ICD-10-CM

## 2019-03-27 DIAGNOSIS — I10 Essential (primary) hypertension: Secondary | ICD-10-CM

## 2019-03-27 DIAGNOSIS — Z1211 Encounter for screening for malignant neoplasm of colon: Secondary | ICD-10-CM

## 2019-03-27 MED ORDER — MONTELUKAST SODIUM 10 MG PO TABS: 10.0000 mg | ORAL_TABLET | Freq: Every day | ORAL | 3 refills | Status: DC

## 2019-03-27 NOTE — Assessment & Plan Note (Signed)
Inadequate control.  Continue Flonase.  We will add Singulair.  If not beneficial would need to consider allergy or ENT referral.

## 2019-03-27 NOTE — Assessment & Plan Note (Signed)
Discussed need for colon cancer screening.  He did note he received the Cologuard box last year though did not complete this.  He wants to confirm what he is going to do with his insurance prior to proceeding with any colon cancer screening.  He is currently on Cobra.

## 2019-03-27 NOTE — Progress Notes (Signed)
Virtual Visit via video Note  This visit type was conducted due to national recommendations for restrictions regarding the COVID-19 pandemic (e.g. social distancing).  This format is felt to be most appropriate for this patient at this time.  All issues noted in this document were discussed and addressed.  No physical exam was performed (except for noted visual exam findings with Video Visits).   I connected with Aaron Dawson today at 12:00 PM EST by a video enabled telemedicine application and verified that I am speaking with the correct person using two identifiers. Location patient: home Location provider: work  Persons participating in the virtual visit: patient, provider  I discussed the limitations, risks, security and privacy concerns of performing an evaluation and management service by telephone and the availability of in person appointments. I also discussed with the patient that there may be a patient responsible charge related to this service. The patient expressed understanding and agreed to proceed.   Reason for visit: follow-up  HPI: HYPERTENSION  Disease Monitoring  Home BP Monitoring <132/83  Chest pain- no    Dyspnea- no Medications  Compliance-  Taking amlodipine.  Edema-   Allergic rhinitis: Using Flonase.  He does still continue to have intermittent congestion particularly at night.  No sneezing.  Rare rhinorrhea.  Occasional postnasal drip.  Some cough related to the postnasal drip.  This has been an ongoing issue for the last year.  He tried Zyrtec with no benefit.  Colon cancer screening: Patient did not complete Cologuard last year.  He is currently in the midst of sorting out his insurance.     ROS: See pertinent positives and negatives per HPI.  Past Medical History:  Diagnosis Date  . GERD (gastroesophageal reflux disease)   . Osgood-Schlatter's disease     Past Surgical History:  Procedure Laterality Date  . ADENOIDECTOMY    . TONSILLECTOMY       Family History  Problem Relation Age of Onset  . Arthritis Mother   . Diabetes Mother   . Hypertension Mother   . Stroke Mother   . Heart attack Mother   . Arthritis Father   . Alcohol abuse Father   . Diabetes Father   . Hearing loss Father   . Hyperlipidemia Father   . Hypertension Father   . Asthma Father   . COPD Maternal Grandmother   . Diabetes Maternal Grandmother     SOCIAL HX: Former smoker   Current Outpatient Medications:  .  amLODipine (NORVASC) 10 MG tablet, TAKE 1 TABLET BY MOUTH EVERY DAY, Disp: 90 tablet, Rfl: 1 .  fluticasone (FLONASE) 50 MCG/ACT nasal spray, SPRAY 2 SPRAYS INTO EACH NOSTRIL EVERY DAY, Disp: 16 mL, Rfl: 1 .  rosuvastatin (CRESTOR) 20 MG tablet, Take 1 tablet (20 mg total) by mouth daily., Disp: 90 tablet, Rfl: 3 .  montelukast (SINGULAIR) 10 MG tablet, Take 1 tablet (10 mg total) by mouth at bedtime., Disp: 30 tablet, Rfl: 3 .  oxyCODONE-acetaminophen (PERCOCET/ROXICET) 5-325 MG tablet, Take 2 tablets by mouth every 4 (four) hours as needed for severe pain. (Patient not taking: Reported on 03/27/2019), Disp: 12 tablet, Rfl: 0  EXAM:  VITALS per patient if applicable:  GENERAL: alert, oriented, appears well and in no acute distress  HEENT: atraumatic, conjunttiva clear, no obvious abnormalities on inspection of external nose and ears  NECK: normal movements of the head and neck  LUNGS: on inspection no signs of respiratory distress, breathing rate appears normal, no obvious gross  SOB, gasping or wheezing  CV: no obvious cyanosis  MS: moves all visible extremities without noticeable abnormality  PSYCH/NEURO: pleasant and cooperative, no obvious depression or anxiety, speech and thought processing grossly intact  ASSESSMENT AND PLAN:  Discussed the following assessment and plan:  Hypertension Adequate control.  Continue current regimen.  Will come in for lab work sometime in the next couple of months.  Allergic  rhinitis Inadequate control.  Continue Flonase.  We will add Singulair.  If not beneficial would need to consider allergy or ENT referral.  Colon cancer screening Discussed need for colon cancer screening.  He did note he received the Cologuard box last year though did not complete this.  He wants to confirm what he is going to do with his insurance prior to proceeding with any colon cancer screening.  He is currently on Cobra.    I discussed the assessment and treatment plan with the patient. The patient was provided an opportunity to ask questions and all were answered. The patient agreed with the plan and demonstrated an understanding of the instructions.   The patient was advised to call back or seek an in-person evaluation if the symptoms worsen or if the condition fails to improve as anticipated.   Tommi Rumps, MD

## 2019-03-27 NOTE — Telephone Encounter (Signed)
This patient reported that we tried to run his insurance and stated it was canceled.  He spoke with his insurance company [employer] and they noted that it would be reinstated by today.  Cecilia advised that we could call [phone] to confirm the patient's insurance.  He noted the member ID should be similar to what we have on file here.

## 2019-03-27 NOTE — Assessment & Plan Note (Signed)
Adequate control.  Continue current regimen.  Will come in for lab work sometime in the next couple of months.

## 2019-06-20 ENCOUNTER — Other Ambulatory Visit: Payer: Self-pay | Admitting: Family Medicine

## 2019-10-02 ENCOUNTER — Other Ambulatory Visit: Payer: Self-pay | Admitting: Family Medicine

## 2019-10-25 ENCOUNTER — Other Ambulatory Visit: Payer: Self-pay | Admitting: Family Medicine

## 2019-12-28 ENCOUNTER — Other Ambulatory Visit: Payer: Self-pay | Admitting: Family Medicine

## 2019-12-28 NOTE — Telephone Encounter (Signed)
1 refill sent to pharmacy.  Patient needs follow-up scheduled for his blood pressure.  Please get him scheduled.  Thanks.

## 2020-02-03 IMAGING — CT CT MAXILLOFACIAL W/O CM
3 series · 16 of 47 positions shown, 19 images · non-contrast
Comparison: None.

CLINICAL DATA: 57 y/o M; hit in face by softball. Teeth 8, 9, and
10 hit by the ball.

EXAM:
CT MAXILLOFACIAL WITHOUT CONTRAST
TECHNIQUE: Multidetector CT imaging of the maxillofacial structures was
performed. Multiplanar CT image reconstructions were also generated.

[Series 2: max soft (person_name) · axial · 0.34mm/px · z∈[+295,+437]mm · 10 of 83 slices shown, 13 images]
[im 6/83  brain]
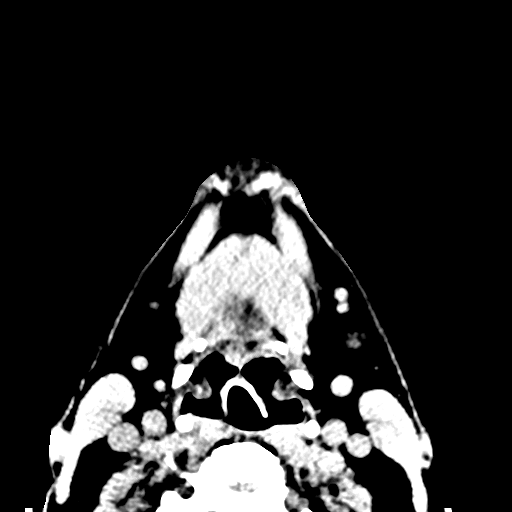
[im 6/83  bone]
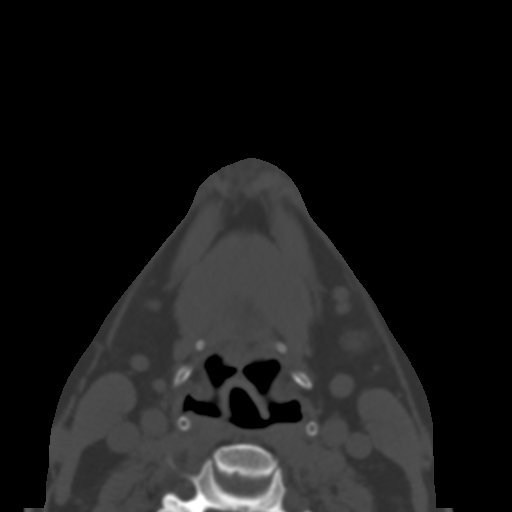
[im 15/83  bone]
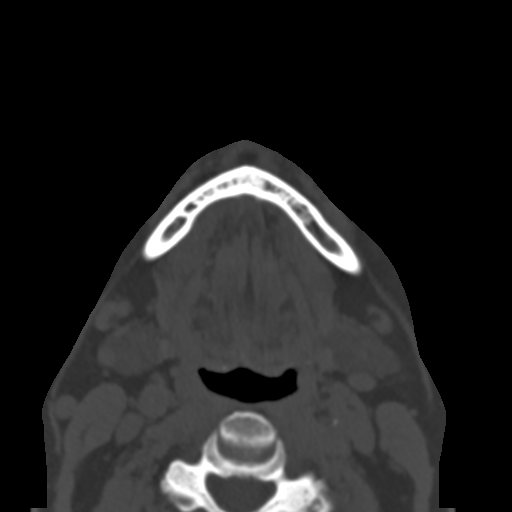
[im 23/83  bone]
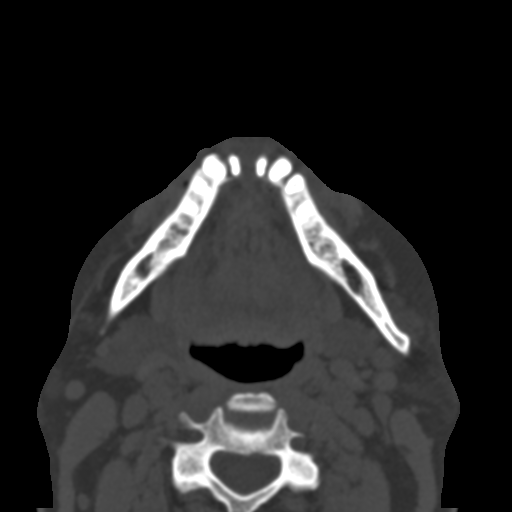
[im 29/83  bone]
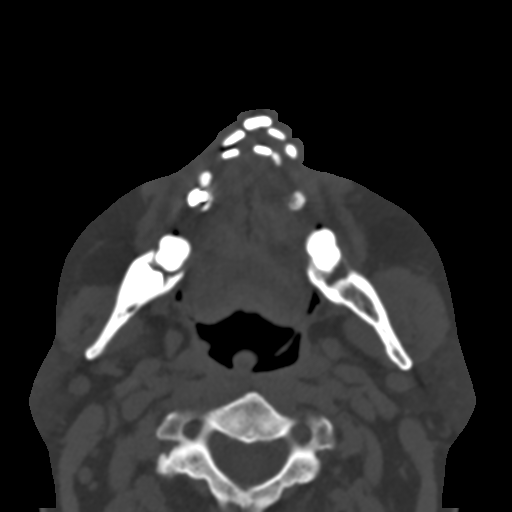
[im 37/83  brain]
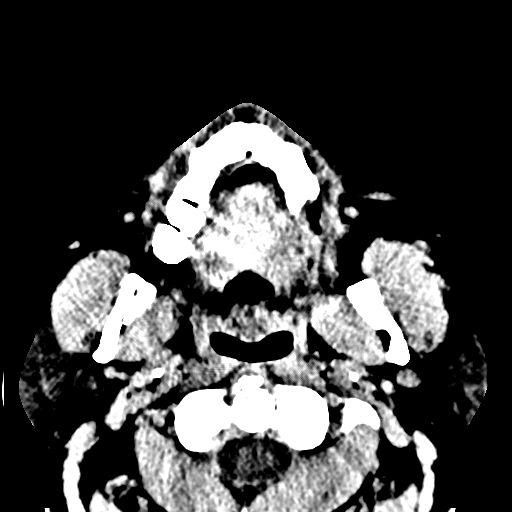
[im 37/83  bone]
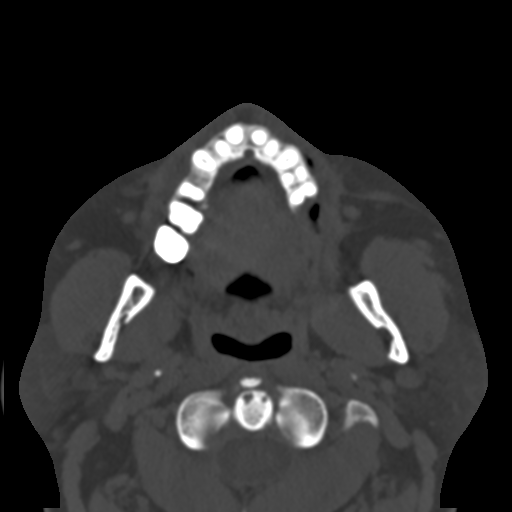
[im 46/83  bone]
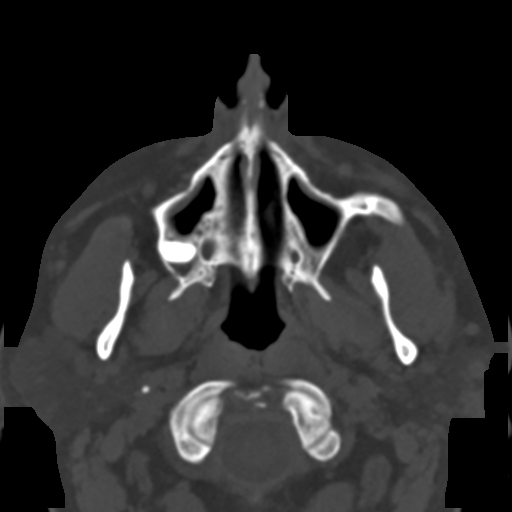
[im 54/83  bone]
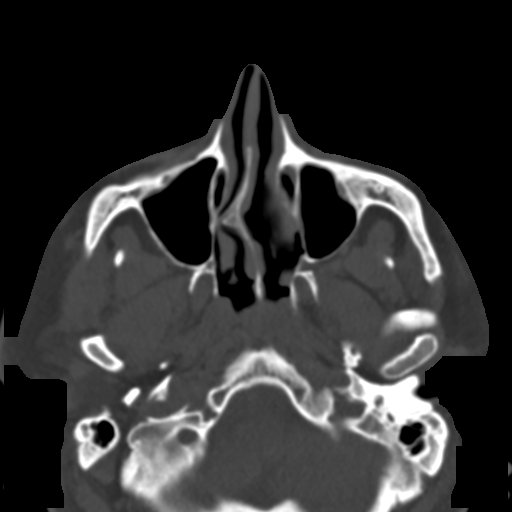
[im 63/83  bone]
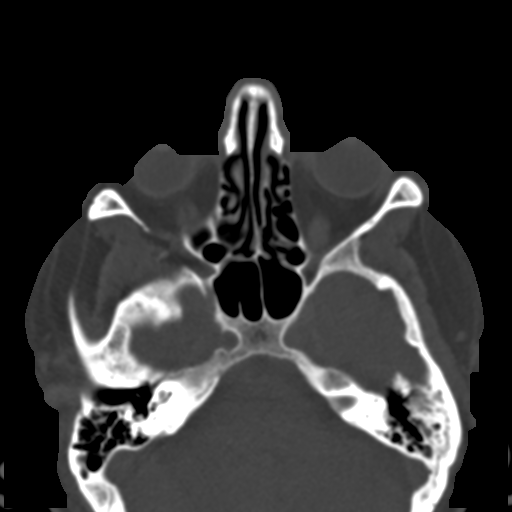
[im 68/83  brain]
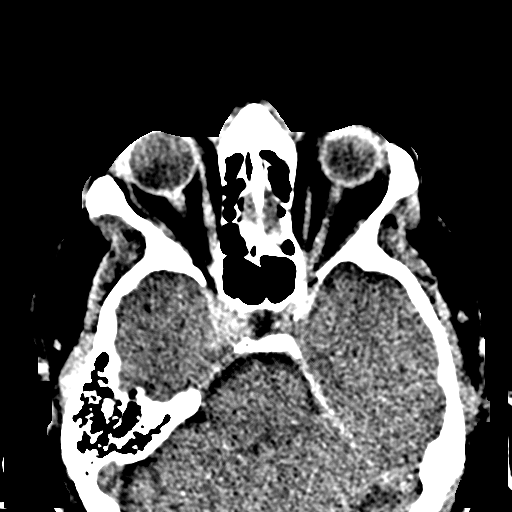
[im 68/83  bone]
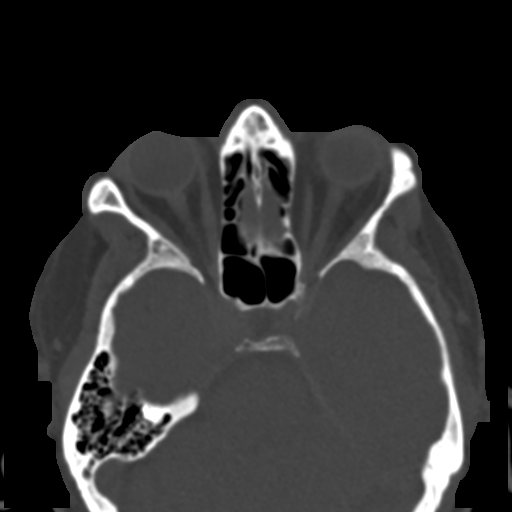
[im 77/83  bone]
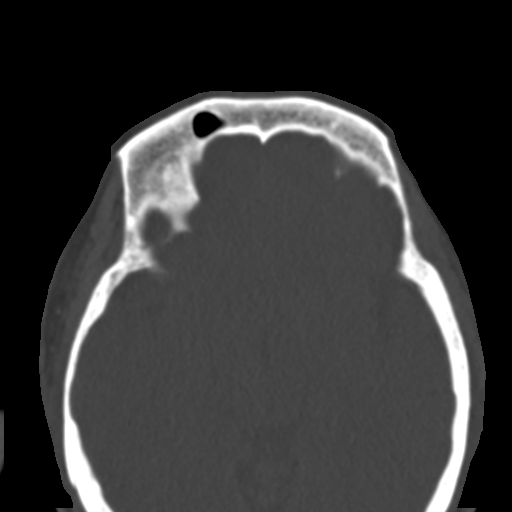

[Series 6: coronal soft · coronal · 0.35mm/px · 3 of 81 slices shown]
[im 36/81  bone]
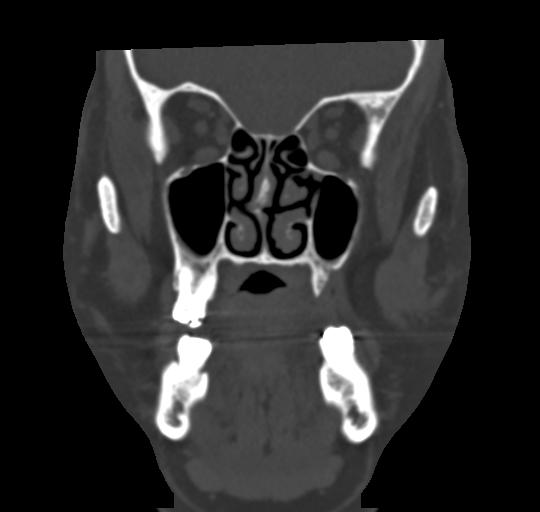
[im 45/81  bone]
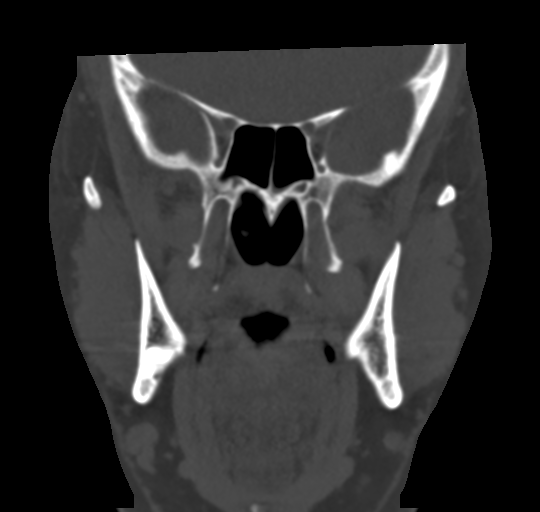
[im 54/81  bone]
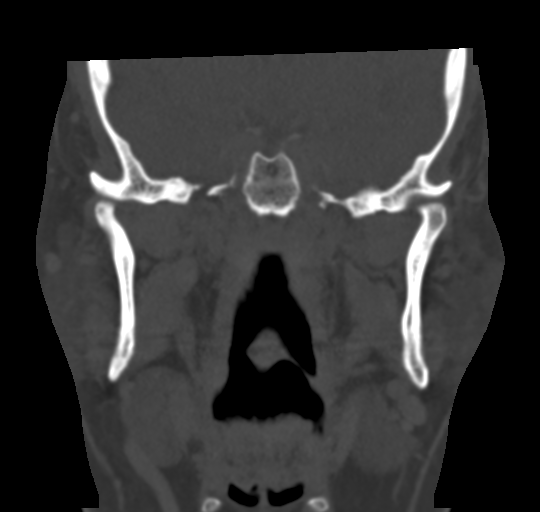

[Series 7: sagittal soft · sagittal · 0.34mm/px · 3 of 81 slices shown]
[im 27/81  bone]
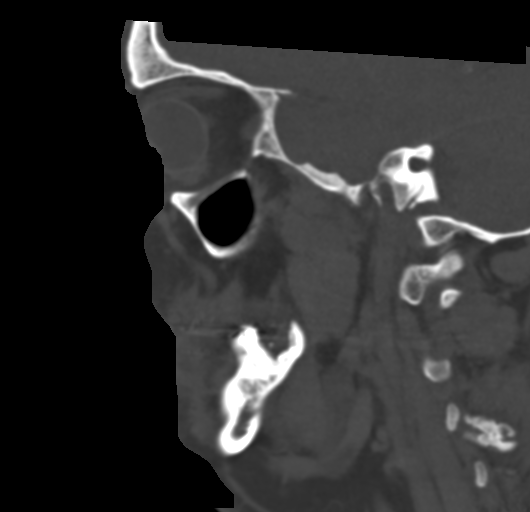
[im 41/81  bone]
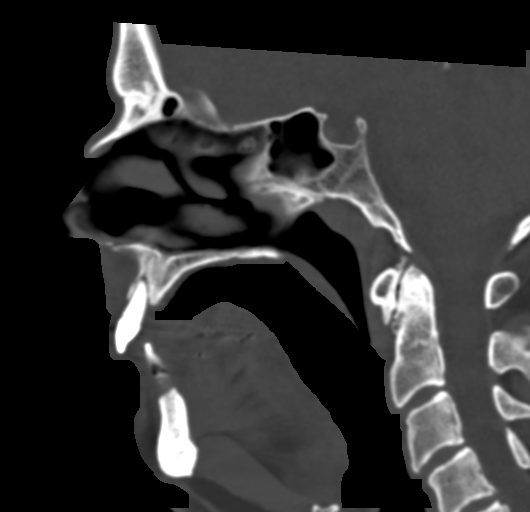
[im 54/81  bone]
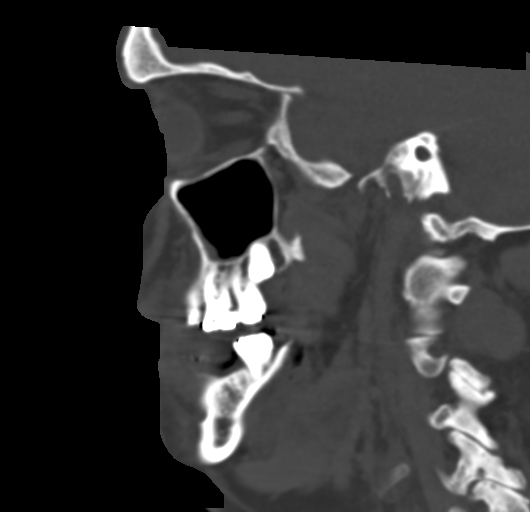

[16 of 47 positions shown; findings below may reference images not displayed]

FINDINGS: Osseous: Acute fracture of the maxillary alveolar bone centered at
the right central incisor extending to the right lateral incisor and
left central incisors. The right central incisor root is anteriorly
displaced from its cavity.

No additional fracture or dislocation identified.

Orbits: Negative. No traumatic or inflammatory finding.

Sinuses: Clear.

Soft tissues: Soft tissue swelling of the upper lip.

Limited intracranial: No significant or unexpected finding.
IMPRESSION: Acute fracture of the maxillary alveolar bone centered at the right
central incisor extending to the right lateral and left central
incisors. The right central incisor root is anteriorly displaced
from its cavity.

By: Daniel Fernandes Barone M.D.

## 2020-08-18 ENCOUNTER — Other Ambulatory Visit: Payer: Self-pay | Admitting: Family Medicine

## 2023-04-12 ENCOUNTER — Ambulatory Visit
Admission: EM | Admit: 2023-04-12 | Discharge: 2023-04-12 | Disposition: A | Discharge status: Home / Self Care | Payer: No Typology Code available for payment source | Attending: Emergency Medicine | Admitting: Emergency Medicine

## 2023-04-12 DIAGNOSIS — J069 Acute upper respiratory infection, unspecified: Secondary | ICD-10-CM

## 2023-04-12 MED ORDER — MONTELUKAST SODIUM 10 MG PO TABS: 10.0000 mg | ORAL_TABLET | Freq: Every day | ORAL | 0 refills | Status: AC

## 2023-04-12 MED ORDER — AZITHROMYCIN 250 MG PO TABS: 250.0000 mg | ORAL_TABLET | Freq: Every day | ORAL | 0 refills | Status: AC

## 2023-04-12 NOTE — Discharge Instructions (Addendum)
 Take the Zithromax as directed.    A 30-day supply of Singulair was sent to your pharmacy.  Please see your primary care provider for additional refills.

## 2023-04-12 NOTE — ED Triage Notes (Signed)
 Patient to Urgent Care with complaints of productive cough/ runny nose/ sneezing/ cold chills/ chest congestion.  Symptoms started four days ago. Requests doctor's note.   Meds: robitussin/ Nyquil/ tylenol.

## 2023-04-12 NOTE — ED Provider Notes (Signed)
 Aaron Dawson    CSN: [id number] Arrival date & time: 04/12/23  1345      History   Chief Complaint Chief Complaint  Patient presents with   URI    HPI Aaron Dawson is a 63 y.o. male.  Patient presents with 4 to 5-day history of congestion, runny nose, sneezing, cough, chills.  No fever or shortness of breath.  He has been treating his symptoms with Robitussin, Tylenol , NyQuil.  Patient states he has taken Singulair  in the past for allergy symptoms and would like a refill.  The history is provided by the patient and medical records.    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 Authorizing Provider  amLODipine  (NORVASC ) 10 MG tablet TAKE 1 TABLET (10 MG TOTAL) BY MOUTH DAILY. PLEASE CALL OFFICE FOR LAB APPT SOON FOR ADDITIONAL REFILLS Patient not taking: Reported on 04/12/2023 10/25/19   Maribeth Camellia MATSU, MD  azithromycin  (ZITHROMAX ) 250 MG tablet Take 1 tablet (250 mg total) by mouth daily. Take first 2 tablets together, then 1 every day until finished. 04/12/23  Yes Corlis Burnard DEL, NP  fluticasone  (FLONASE ) 50 MCG/ACT nasal spray SPRAY 2 SPRAYS INTO EACH NOSTRIL EVERY DAY 11/07/18   Maribeth Camellia MATSU, MD  montelukast  (SINGULAIR ) 10 MG tablet Take 1  tablet (10 mg total) by mouth at bedtime. 04/12/23  Yes Corlis Burnard DEL, NP  oxyCODONE -acetaminophen  (PERCOCET/ROXICET) 5-325 MG tablet Take 2 tablets by mouth every 4 (four) hours as needed for severe pain. Patient not taking: Reported on 03/27/2019 11/25/17   Charlann Isaiah SQUIBB, MD  rosuvastatin  (CRESTOR ) 20 MG tablet Take 1 tablet (20 mg total) by mouth daily. Patient not taking: Reported on 04/12/2023 10/11/17   Maribeth Camellia MATSU, MD    Family History Family History  Problem Relation Age of Onset   Arthritis Mother    Diabetes Mother    Hypertension Mother    Stroke Mother    Heart attack Mother    Arthritis Father    Alcohol abuse Father    Diabetes Father    Hearing loss Father    Hyperlipidemia Father    Hypertension Father    Asthma Father    COPD Maternal Grandmother    Diabetes Maternal Grandmother     Social History Social History   Tobacco Use   Smoking status: Former   Smokeless tobacco: Never  Substance Use Topics   Alcohol use: Yes   Drug use: Never     Allergies   Penicillins   Review of Systems Review of Systems  Constitutional:  Positive for chills. Negative for fever.  HENT:  Positive for congestion, postnasal drip, rhinorrhea and sneezing. Negative for ear pain and sore throat.   Respiratory:  Positive for cough. Negative for shortness of breath.   Cardiovascular:  Negative for chest pain and palpitations.  Gastrointestinal:  Negative for diarrhea and vomiting.     Physical Exam Triage Vital Signs ED Triage Vitals [04/12/23 1505]  Encounter Vitals Group     BP 139/78     Systolic BP Percentile      Diastolic BP Percentile      Pulse Rate (!) 104     Resp 18     Temp 98.2 F (36.8 C)     Temp src      SpO2 97 %     Weight      Height      Head Circumference      Peak Flow      Pain Score      Pain Loc      Pain Education      Exclude from Growth Chart    No data found.  Updated Vital Signs BP 139/78   Pulse (!) 104   Temp 98.2  F (36.8 C)   Resp 18   SpO2 97%   Visual Acuity Right Eye Distance:   Left Eye Distance:   Bilateral Distance:    Right Eye Near:   Left Eye Near:    Bilateral Near:     Physical Exam Constitutional:      General: He is not in acute distress. HENT:     Right Ear: Tympanic membrane normal.     Left Ear: Tympanic membrane normal.     Nose: Congestion and rhinorrhea present.     Mouth/Throat:     Mouth: Mucous membranes are moist.     Pharynx: Oropharynx is clear.  Cardiovascular:     Rate and Rhythm: Normal rate and regular rhythm.     Heart sounds: Normal heart sounds.  Pulmonary:     Effort: Pulmonary effort is normal. No respiratory distress.     Breath sounds: Normal breath sounds.  Neurological:     Mental Status: He is alert.      UC Treatments / Results  Labs (all labs ordered are listed, but only abnormal results are displayed) Labs Reviewed - No data to display  EKG   Radiology No results found.  Procedures Procedures (including critical care time)  Medications Ordered in UC Medications - No data to display  Initial Impression / Assessment and Plan / UC Course  I have reviewed the triage vital signs and the nursing notes.  Pertinent labs & imaging results that were available during my care of the patient were reviewed by me and considered in my medical decision making (see chart for details).    Acute upper respiratory infection.  Treating today with Zithromax .  30-day supply of Singulair  also sent to pharmacy and instructed patient to follow-up with his PCP for additional refills.  Discussed symptomatic management including Tylenol  or ibuprofen as needed.  Plain Mucinex as needed.  Instructed him to follow-up with his PCP if he is not improving.  Work note provided per patient request.  He agrees to plan of care.  Final Clinical Impressions(s) / UC Diagnoses   Final diagnoses:  Acute upper respiratory infection     Discharge Instructions       Take the Zithromax  as directed.    A 30-day supply of Singulair  was sent to your pharmacy.  Please see your primary care provider for additional refills.     ED Prescriptions     Medication Sig Dispense Auth.  Provider   montelukast  (SINGULAIR ) 10 MG tablet Take 1 tablet (10 mg total) by mouth at bedtime. 30 tablet Corlis Burnard DEL, NP   azithromycin  (ZITHROMAX ) 250 MG tablet Take 1 tablet (250 mg total) by mouth daily. Take first 2 tablets together, then 1 every day until finished. 6 tablet Corlis Burnard DEL, NP      PDMP not reviewed this encounter.   Corlis Burnard DEL, NP 04/12/23 [phone]

## 2024-03-12 ENCOUNTER — Emergency Department
Admission: EM | Admit: 2024-03-12 | Discharge: 2024-03-13 | Disposition: A | Payer: Self-pay | Attending: Emergency Medicine | Admitting: Emergency Medicine

## 2024-03-12 ENCOUNTER — Encounter: Payer: Self-pay | Admitting: *Deleted

## 2024-03-12 ENCOUNTER — Other Ambulatory Visit: Payer: Self-pay

## 2024-03-12 DIAGNOSIS — I10 Essential (primary) hypertension: Secondary | ICD-10-CM

## 2024-03-12 DIAGNOSIS — R739 Hyperglycemia, unspecified: Secondary | ICD-10-CM

## 2024-03-12 LAB — CBC
HCT: 52.3 % — ABNORMAL HIGH (ref 39.0–52.0)
Hemoglobin: 17.8 g/dL — ABNORMAL HIGH (ref 13.0–17.0)
MCH: 33.3 pg (ref 26.0–34.0)
MCHC: 34 g/dL (ref 30.0–36.0)
MCV: 97.8 fL (ref 80.0–100.0)
Platelets: 157 K/uL (ref 150–400)
RBC: 5.35 MIL/uL (ref 4.22–5.81)
RDW: 12.6 % (ref 11.5–15.5)
WBC: 7 K/uL (ref 4.0–10.5)
nRBC: 0 % (ref 0.0–0.2)

## 2024-03-12 LAB — BASIC METABOLIC PANEL WITH GFR
Anion gap: 17 — ABNORMAL HIGH (ref 5–15)
BUN: 15 mg/dL (ref 8–23)
CO2: 26 mmol/L (ref 22–32)
Calcium: 10.3 mg/dL (ref 8.9–10.3)
Chloride: 99 mmol/L (ref 98–111)
Creatinine, Ser: 1.52 mg/dL — ABNORMAL HIGH (ref 0.61–1.24)
GFR, Estimated: 51 mL/min — ABNORMAL LOW (ref >60)
Glucose, Bld: 254 mg/dL — ABNORMAL HIGH (ref 70–99)
Potassium: 4 mmol/L (ref 3.5–5.1)
Sodium: 143 mmol/L (ref 135–145)

## 2024-03-12 MED ORDER — LISINOPRIL 5 MG PO TABS
2.5000 mg | ORAL_TABLET | Freq: Once | ORAL | Status: AC
  Administered 2024-03-12: 2.5 mg via ORAL
  Filled 2024-03-12: qty 1

## 2024-03-12 MED ORDER — LISINOPRIL 2.5 MG PO TABS: 2.5000 mg | ORAL_TABLET | Freq: Every day | ORAL | 2 refills | Status: AC

## 2024-03-12 NOTE — ED Triage Notes (Addendum)
 Pt ambulatory to triage.  Pt reports elevated blood pressure today.  No chest pain or sob.  Pt denies swelling to feet/ankles/legs   no headache or dizziness.  Pt takes no bp meds.  pt alert  speech clear.

## 2024-03-12 NOTE — ED Notes (Signed)
 Pt to room 17 from triage.

## 2024-03-12 NOTE — ED Provider Notes (Signed)
 [facility] Provider Note    Event Date/Time   First MD Initiated Contact with Patient 03/12/24 2303     (approximate)   History   Hypertension   HPI  Aaron Dawson is a 63 y.o. male does not take any medications.  Reports has not seen a doctor in about 4 years  He has noticed over the last several weeks that when he checks his blood pressure with sometimes read as monitor moderate hypertension but today it read severe.  He checks it on occasion.  No other symptoms.  But concerned his blood pressure has been reading high and it seems to be steadily worsening though historically he was followed at seemed okay until recently when he started having higher blood pressures  No chest pain no headache no swelling.  He was once on blood pressure medicine amlodipine  but discontinued it, has not seen his PCP for about 4 years   Past Medical History:  Diagnosis Date   GERD (gastroesophageal reflux disease)    Osgood-Schlatter's disease   HTN     Physical Exam   Triage Vital Signs: ED Triage Vitals  Encounter Vitals Group     BP 03/12/24 2206 (!) 214/109     Girls Systolic BP Percentile --      Girls Diastolic BP Percentile --      Boys Systolic BP Percentile --      Boys Diastolic BP Percentile --      Pulse Rate 03/12/24 2206 100     Resp 03/12/24 2206 18     Temp 03/12/24 2206 98.2 F (36.8 C)     Temp Source 03/12/24 2206 Oral     SpO2 03/12/24 2206 98 %     Weight 03/12/24 2205 250 lb (113.4 kg)     Height 03/12/24 2205 6' 2 (1.88 m)     Head Circumference --      Peak Flow --      Pain Score 03/12/24 2205 0     Pain Loc --      Pain Education --      Exclude from Growth Chart --     Most recent vital signs: Vitals:   03/12/24 2206 03/12/24 2344  BP: (!) 214/109 (!) 188/106  Pulse: 100   Resp: 18   Temp: 98.2 F (36.8 C)   SpO2: 98%      General: Awake, no distress.  CV:  Good peripheral perfusion.  Normal tones and  rate Resp:  Normal effort.  Clear bilateral Abd:  No distention.  Other:  No lower extremity edema no venous cords or congestion   ED Results / Procedures / Treatments   Labs (all labs ordered are listed, but only abnormal results are displayed) Labs Reviewed  BASIC METABOLIC PANEL WITH GFR - Abnormal; Notable for the following components:      Result Value   Glucose, Bld 254 (*)    Creatinine, Ser 1.52 (*)    GFR, Estimated 51 (*)    Anion gap 17 (*)    All other components within normal limits  CBC - Abnormal; Notable for the following components:   Hemoglobin 17.8 (*)    HCT 52.3 (*)    All other components within normal limits  HEMOGLOBIN A1C   Mild the elevated creatinine from baseline.  His last baseline several years ago.  He has no acute symptoms to suggest acute dehydration abdominal cause obstructive uropathy etc.  He also has noted that he  has mild hyperglycemia without evidence of DKA.  Based on his symptoms and the clinical history provide I am suspicious he may be developing a metabolic syndrome  We will send A1c.  Counseled patient on need to follow-up with a primary care follow-up on his A1c and he is agreeable with this plan.  He will try to reengage with the clinic he previously went to where Dr. Maribeth used to work  EKG     RADIOLOGY     PROCEDURES:  Critical Care performed: No  Procedures   MEDICATIONS ORDERED IN ED: Medications  lisinopril  (ZESTRIL ) tablet 2.5 mg (2.5 mg Oral Given 03/12/24 2344)     IMPRESSION / MDM / ASSESSMENT AND PLAN / ED COURSE  I reviewed the triage vital signs and the nursing notes.                              Differential diagnosis includes, but is not limited to, asymptomatic hypertension.  Reassuring exam fully alert well-oriented moves all extremities well no facial droop clear speech no strokelike symptoms no chest pain no evidence of hypertensive emergency etc.  Discussed with patient some of the common  and also more other concerning though seems low risk in his demographic risks of lisinopril  including angioedema.  Counseled on how to manage emergency situation if this does arise in the risk that this could cause.  He is agreeable to start low-dose lisinopril  he did not tolerate amlodipine  well as he reported it caused him edema in his feet after being on it for some time  Patient's presentation is most consistent with acute complicated illness / injury requiring diagnostic workup.   Return precautions and treatment recommendations and follow-up discussed with the patient who is agreeable with the plan.        FINAL CLINICAL IMPRESSION(S) / ED DIAGNOSES   Final diagnoses:  Hypertension, unspecified type  Uncontrolled hypertension  Hyperglycemia     Rx / DC Orders   ED Discharge Orders          Ordered    lisinopril  (ZESTRIL ) 2.5 MG tablet  Daily        03/12/24 2320    Ambulatory Referral to Primary Care (Establish Care)        03/12/24 2346    Ambulatory Referral to Primary Care (Establish Care)        03/12/24 2346             Note:  This document was prepared using Dragon voice recognition software and may include unintentional dictation errors.   Dicky Anes, MD 03/12/24 2348

## 2024-03-13 LAB — HEMOGLOBIN A1C
Hgb A1c MFr Bld: 7.5 % — ABNORMAL HIGH (ref 4.8–5.6)
Mean Plasma Glucose: 168.55 mg/dL
